# Patient Record
Sex: Male | Born: 1940 | Race: White | Hispanic: No | Marital: Married | Smoking: Never smoker
Health system: Southern US, Community
[De-identification: ages and names within clinical notes are randomized; demographics above are authoritative.]

## PROBLEM LIST (undated history)

## (undated) DIAGNOSIS — J45909 Unspecified asthma, uncomplicated: Secondary | ICD-10-CM

## (undated) DIAGNOSIS — C801 Malignant (primary) neoplasm, unspecified: Secondary | ICD-10-CM

## (undated) DIAGNOSIS — I10 Essential (primary) hypertension: Secondary | ICD-10-CM

## (undated) DIAGNOSIS — Z950 Presence of cardiac pacemaker: Secondary | ICD-10-CM

## (undated) HISTORY — PX: CARDIAC SURGERY: SHX584

---

## 2009-11-12 ENCOUNTER — Emergency Department: Payer: Self-pay | Admitting: Emergency Medicine

## 2010-04-16 ENCOUNTER — Emergency Department: Payer: Self-pay | Admitting: Emergency Medicine

## 2012-04-19 IMAGING — CT CT CHEST W/ CM
2 series · 15 of 31 positions shown, 19 images · IV contrast (APPLIED)
Comparison: None

REASON FOR EXAM: dyspnea, tachycardia, h/o lung cancer sp resection
COMMENTS:

PROCEDURE:     CT  - CT CHEST (FOR PE) W  - April 16, 2010  [DATE]
RESULT:     Indications: History of lung cancer, dyspnea, tachycardia
TECHNIQUE: A thin-section spiral CT from the lung apices to the upper
abdomen was acquired on a multi slice scanner following 100ml Usovue-CKX
intravenous contrast. These images were then transferred to the Siemens work
station and were subsequently reviewed utilizing 3-D reconstructions and MIP
images.

[Series 4: soft tissue · axial · 0.80mm/px · z∈[-435,-387]mm · 2 of 101 slices shown]
[im 8/101  mediastinal]
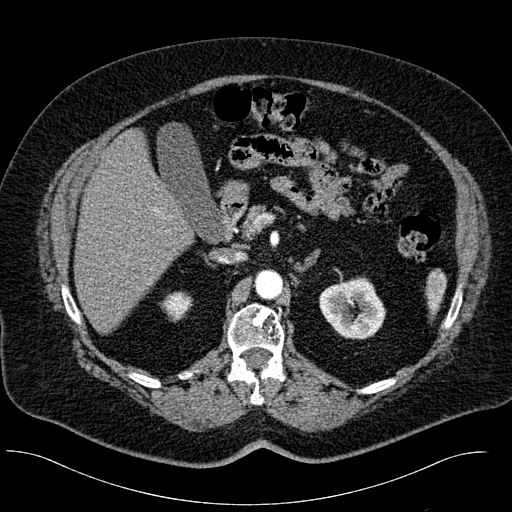
[im 24/101  mediastinal]
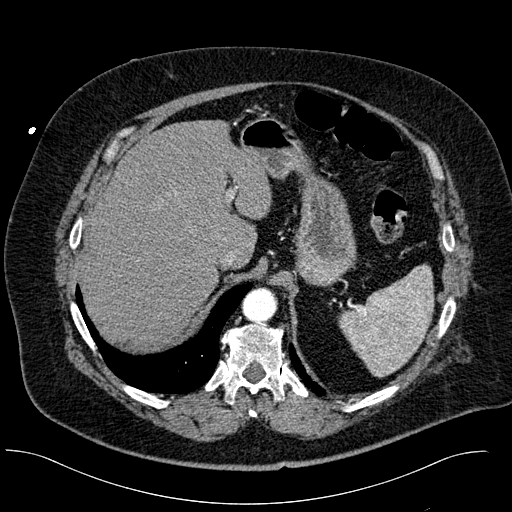

[Series 5: lung windows · axial · 0.80mm/px · z∈[-429,-180]mm · 13 of 99 slices shown, 17 images]
[im 8/99  mediastinal]
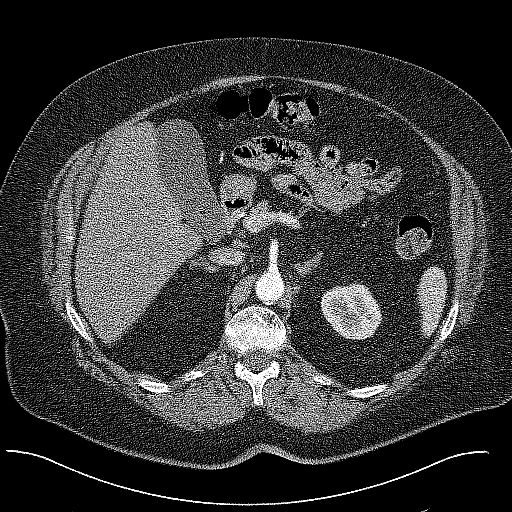
[im 8/99  lung]
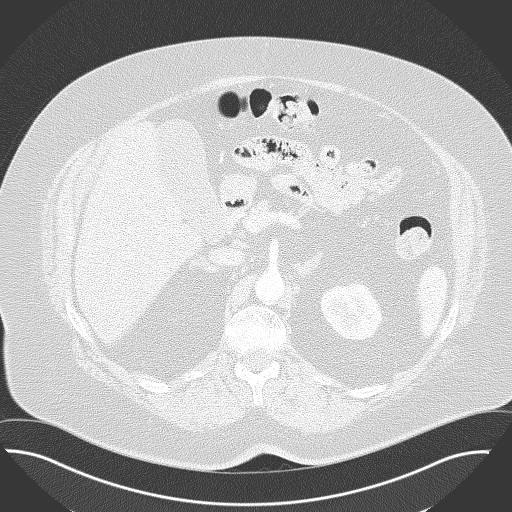
[im 16/99  lung]
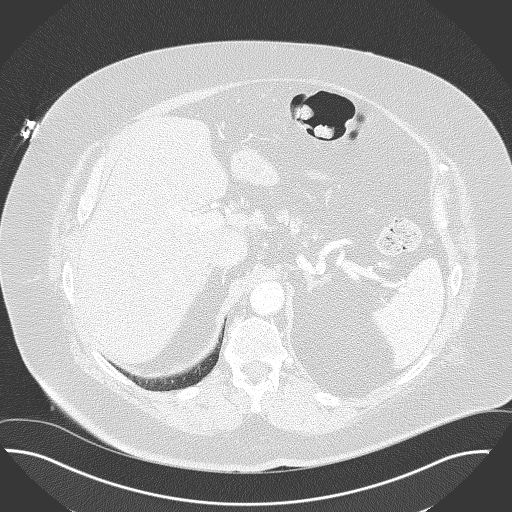
[im 23/99  lung]
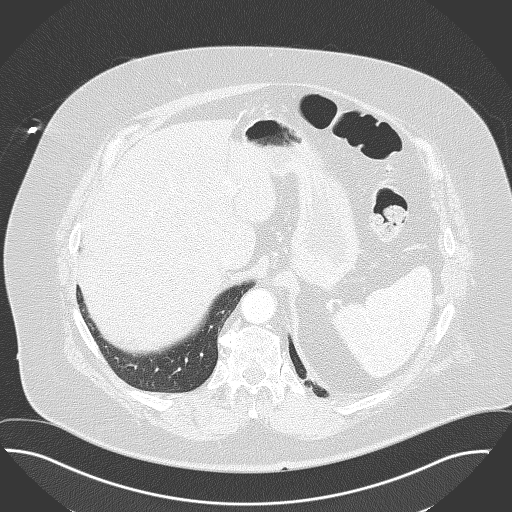
[im 31/99  lung]
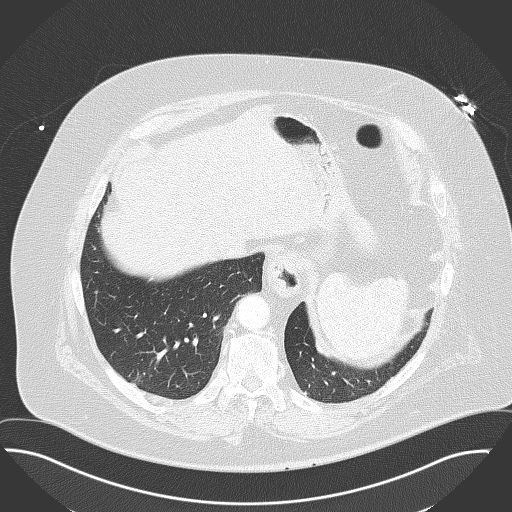
[im 38/99  mediastinal]
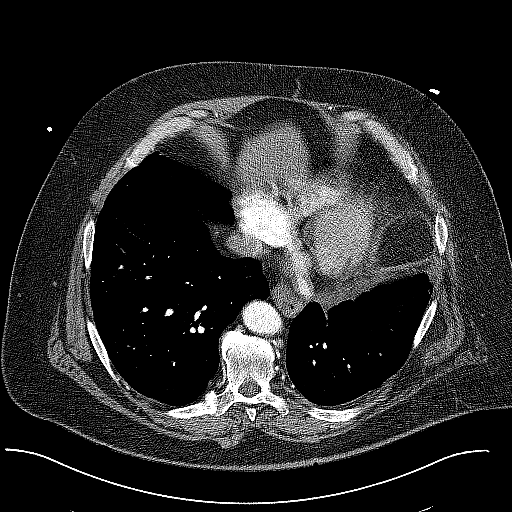
[im 38/99  lung]
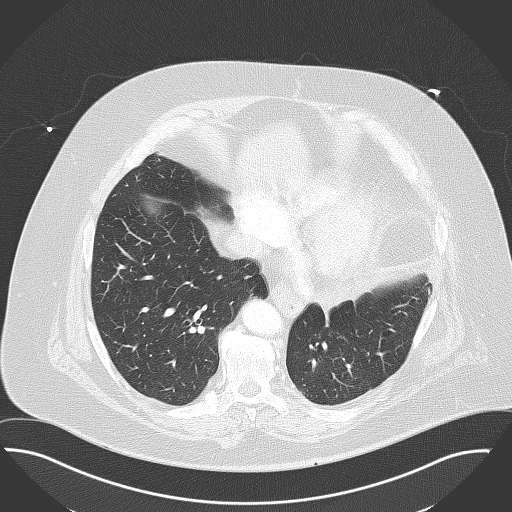
[im 46/99  lung]
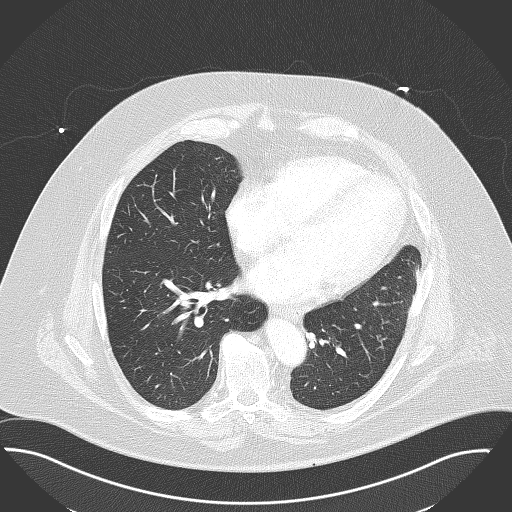
[im 50/99  lung]
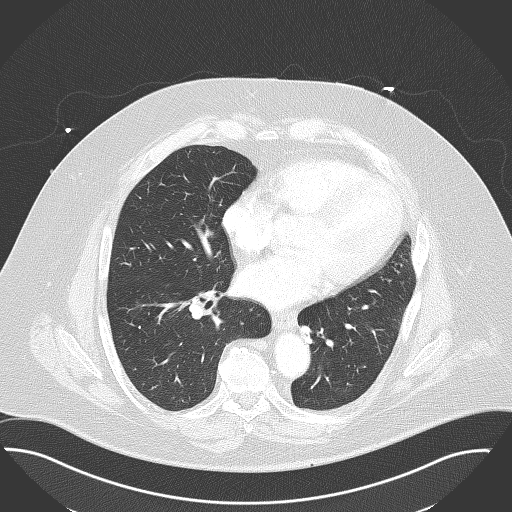
[im 53/99  lung]
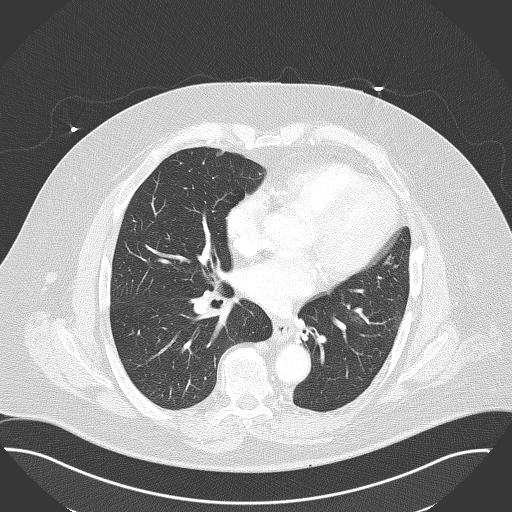
[im 61/99  mediastinal]
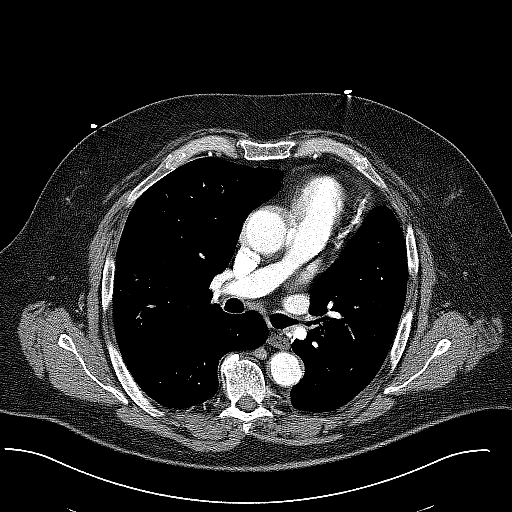
[im 61/99  lung]
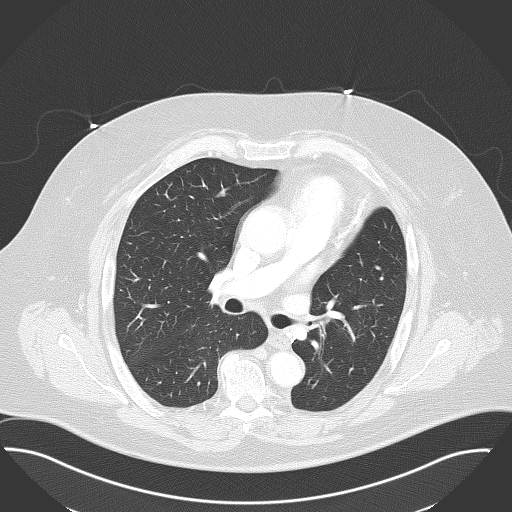
[im 68/99  lung]
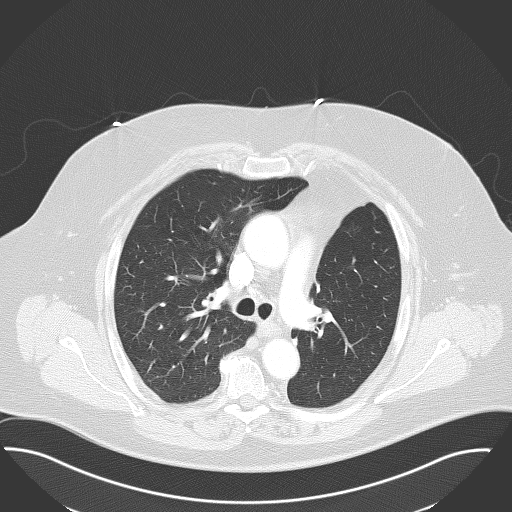
[im 76/99  lung]
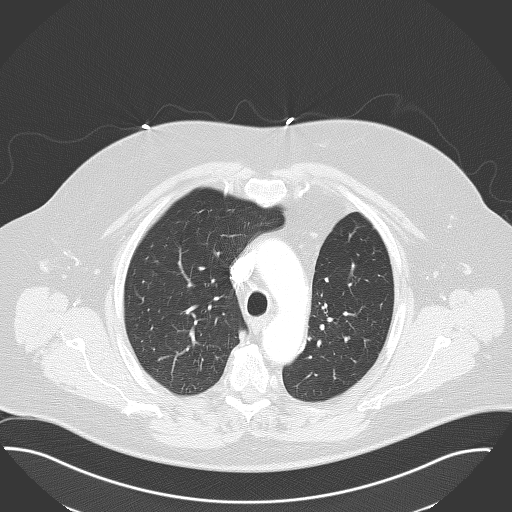
[im 83/99  lung]
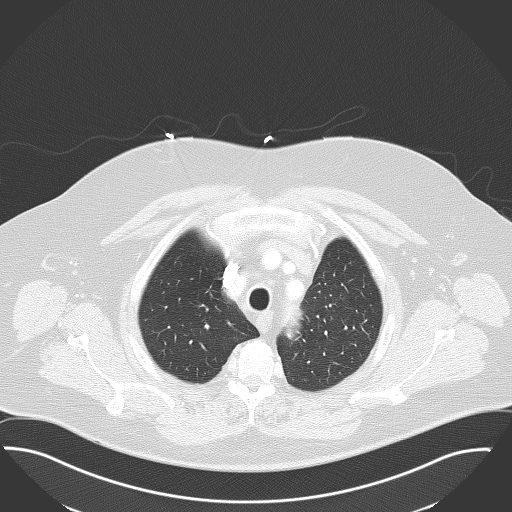
[im 91/99  mediastinal]
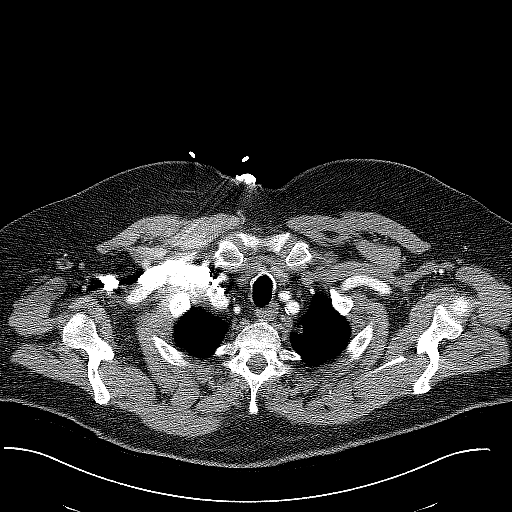
[im 91/99  lung]
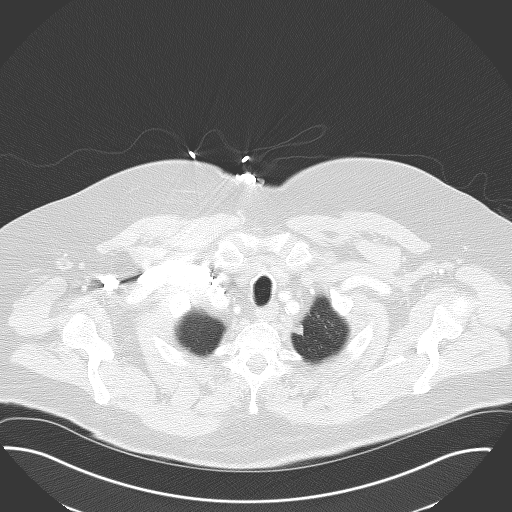

[15 of 31 positions shown; findings below may reference images not displayed]

FINDINGS: There is adequate opacification of the pulmonary arteries. There is no
pulmonary embolus. The main pulmonary artery, right main pulmonary artery,
and left main pulmonary arteries are normal in size. The heart size is
normal. There is no pericardial effusion. There is thoracic aortic
atherosclerosis. There is coronary artery atherosclerosis involving the LAD
and right coronary artery.

There is evidence of prior left partial pneumonectomy. The lungs are clear.
There is no focal consolidation, pleural effusion, or pneumothorax.

There is no axillary, hilar, or mediastinal adenopathy.

There is a small axial type hiatal hernia.

There is a small hiatal hernia.
IMPRESSION: 1. No CT evidence of pulmonary embolus.

2. Coronary artery disease.

## 2014-11-07 ENCOUNTER — Emergency Department: Payer: Self-pay | Admitting: Emergency Medicine

## 2014-11-07 LAB — COMPREHENSIVE METABOLIC PANEL
ALK PHOS: 67 U/L
ALT: 19 U/L
Albumin: 3.6 g/dL (ref 3.4–5.0)
Anion Gap: 11 (ref 7–16)
BUN: 20 mg/dL — ABNORMAL HIGH (ref 7–18)
Bilirubin,Total: 0.5 mg/dL (ref 0.2–1.0)
CHLORIDE: 102 mmol/L (ref 98–107)
CREATININE: 0.99 mg/dL (ref 0.60–1.30)
Calcium, Total: 8.9 mg/dL (ref 8.5–10.1)
Co2: 25 mmol/L (ref 21–32)
EGFR (Non-African Amer.): >60
Glucose: 107 mg/dL — ABNORMAL HIGH (ref 65–99)
OSMOLALITY: 279 (ref 275–301)
Potassium: 3.4 mmol/L — ABNORMAL LOW (ref 3.5–5.1)
SGOT(AST): 22 U/L (ref 15–37)
SODIUM: 138 mmol/L (ref 136–145)
Total Protein: 7 g/dL (ref 6.4–8.2)

## 2014-11-07 LAB — CBC
HCT: 38.8 % — AB (ref 40.0–52.0)
HGB: 12.9 g/dL — ABNORMAL LOW (ref 13.0–18.0)
MCH: 29.2 pg (ref 26.0–34.0)
MCHC: 33.2 g/dL (ref 32.0–36.0)
MCV: 88 fL (ref 80–100)
Platelet: 197 10*3/uL (ref 150–440)
RBC: 4.42 10*6/uL (ref 4.40–5.90)
RDW: 13.7 % (ref 11.5–14.5)
WBC: 12.4 10*3/uL — ABNORMAL HIGH (ref 3.8–10.6)

## 2014-11-07 LAB — TROPONIN I: TROPONIN-I: 0.02 ng/mL

## 2015-03-06 ENCOUNTER — Emergency Department: Admit: 2015-03-06 | Disposition: A | Discharge status: Home / Self Care | Payer: Self-pay | Admitting: Family Medicine

## 2015-04-17 ENCOUNTER — Emergency Department
Admission: EM | Admit: 2015-04-17 | Discharge: 2015-04-17 | Disposition: A | Discharge status: Home / Self Care | Payer: Medicare Other | Attending: Emergency Medicine | Admitting: Emergency Medicine

## 2015-04-17 ENCOUNTER — Encounter: Payer: Self-pay | Admitting: Emergency Medicine

## 2015-04-17 ENCOUNTER — Emergency Department: Payer: Medicare Other

## 2015-04-17 DIAGNOSIS — C3490 Malignant neoplasm of unspecified part of unspecified bronchus or lung: Secondary | ICD-10-CM | POA: Diagnosis not present

## 2015-04-17 DIAGNOSIS — C61 Malignant neoplasm of prostate: Secondary | ICD-10-CM | POA: Diagnosis not present

## 2015-04-17 DIAGNOSIS — M898X9 Other specified disorders of bone, unspecified site: Secondary | ICD-10-CM

## 2015-04-17 DIAGNOSIS — C799 Secondary malignant neoplasm of unspecified site: Secondary | ICD-10-CM | POA: Insufficient documentation

## 2015-04-17 DIAGNOSIS — I10 Essential (primary) hypertension: Secondary | ICD-10-CM | POA: Insufficient documentation

## 2015-04-17 DIAGNOSIS — C419 Malignant neoplasm of bone and articular cartilage, unspecified: Secondary | ICD-10-CM | POA: Diagnosis not present

## 2015-04-17 DIAGNOSIS — R079 Chest pain, unspecified: Secondary | ICD-10-CM | POA: Diagnosis present

## 2015-04-17 DIAGNOSIS — C7951 Secondary malignant neoplasm of bone: Secondary | ICD-10-CM

## 2015-04-17 HISTORY — DX: Presence of cardiac pacemaker: Z95.0

## 2015-04-17 HISTORY — DX: Malignant (primary) neoplasm, unspecified: C80.1

## 2015-04-17 HISTORY — DX: Essential (primary) hypertension: I10

## 2015-04-17 HISTORY — DX: Unspecified asthma, uncomplicated: J45.909

## 2015-04-17 LAB — CBC
HCT: 40.1 % (ref 40.0–52.0)
Hemoglobin: 13.5 g/dL (ref 13.0–18.0)
MCH: 28.3 pg (ref 26.0–34.0)
MCHC: 33.7 g/dL (ref 32.0–36.0)
MCV: 83.9 fL (ref 80.0–100.0)
Platelets: 201 10*3/uL (ref 150–440)
RBC: 4.78 MIL/uL (ref 4.40–5.90)
RDW: 14.6 % — ABNORMAL HIGH (ref 11.5–14.5)
WBC: 12.3 10*3/uL — ABNORMAL HIGH (ref 3.8–10.6)

## 2015-04-17 LAB — BASIC METABOLIC PANEL
Anion gap: 11 (ref 5–15)
BUN: 27 mg/dL — ABNORMAL HIGH (ref 6–20)
CHLORIDE: 104 mmol/L (ref 101–111)
CO2: 25 mmol/L (ref 22–32)
Calcium: 9.2 mg/dL (ref 8.9–10.3)
Creatinine, Ser: 1.12 mg/dL (ref 0.61–1.24)
GFR calc Af Amer: >60 mL/min (ref >60)
Glucose, Bld: 119 mg/dL — ABNORMAL HIGH (ref 65–99)
Potassium: 3.7 mmol/L (ref 3.5–5.1)
Sodium: 140 mmol/L (ref 135–145)

## 2015-04-17 LAB — TROPONIN I: Troponin I: 0.03 ng/mL (ref <0.031)

## 2015-04-17 MED ORDER — HYDROMORPHONE HCL 1 MG/ML IJ SOLN
INTRAMUSCULAR | Status: AC
  Filled 2015-04-17: qty 1

## 2015-04-17 MED ORDER — OXYCODONE-ACETAMINOPHEN 10-325 MG PO TABS: 1.0000 | ORAL_TABLET | Freq: Four times a day (QID) | ORAL | Status: AC | PRN

## 2015-04-17 MED ORDER — HYDROMORPHONE HCL 1 MG/ML IJ SOLN
1.0000 mg | Freq: Once | INTRAMUSCULAR | Status: AC
  Administered 2015-04-17: 1 mg via INTRAMUSCULAR

## 2015-04-17 NOTE — ED Notes (Signed)
Patient reports chest pain that began at approx 0300. States he went to relax on couch at midnight. Noticed chest pain, took pain medicine and Aleve with no relief.

## 2015-04-17 NOTE — Discharge Instructions (Signed)
Bone Metastases °Cancerous growths can begin in any part of the body. The original site of cancer is called the primary tumor or primary cancer (for example, breast cancer). After cancer has developed in one area of the body, cancerous cells from that area can break away and travel through the body's bloodstream. If these cancerous cells begin growing in another place in the body, they are called metastases. Bone metastases are cancer cells that have spread to the bone (which is different from a cancer that starts in the bone). °These secondary growths are like the original tumor. For example, if a prostate cancer spreads to bone it is called metastatic prostate cancer, or prostate cancer metastatic to bone, but not bone cancer. Cancers can spread to almost any bone; the spine and pelvis are often involved.  °Any type of cancer can spread to the bone, but the most common are breast, lung, kidney, thyroid and prostate cancers. Sometimes the primary tumor is not discovered until there are bone problems. If the primary cancer location cannot be discovered, the cancer is called cancer of unknown primary location. °SYMPTOMS  °Pain in the bones is the main symptom of bone metastases. Some other problems may occur first including: °· Decreased appetite. °· Nausea. °· Muscle weakness. °· Confusion. °· Unusual sleep patterns due to discomfort. °· Overly tired (fatigue). °· Restlessness. °Frail or brittle bones may lead to broken bones (fractures) that lead to learning what is wrong (diagnosis). A tumor often weakens the bones.  °DIAGNOSIS  °Metastatic cancers may be found months or years after or at the same time as the primary tumor. When a second tumor is found in a patient who has been treated for cancer, it is more often a metastasis than another primary tumor.  °The patient's symptoms, physical examination, X-rays and blood tests may suggest a bone metastases. In addition, an examination of tissue or a cell sample  (biopsy) is usually done to find the cancer. This sample is removed with a needle. This tissue sample must be looked at under a microscope to confirm a diagnosis. °TREATMENT  °Options generally include treatments that give relief from symptoms (palliative) or curative. Those with advanced, metastasized cancer may receive treatment focused on pain relief and prolonging life. These treatments depend on the type of cancer and its location.  °Treatment for cancer depends on its type and location. Some of these treatments are: °· Surgery to remove the original tumor and/or to remove parts of the body that produce hormones and other chemicals that make cancer worse. °· Treatment with drugs (chemotherapy). °· Bone marrow transplantations on rare occasions. °· Radiation therapy (radiotherapy). °· Hormonal therapy. °· Pain relieving medications. °Your caregiver will help you understand the likelihood that any particular treatment will be helpful for you. While some treatments aim to cure or control the cancer, others give relief from symptoms only. If you have bone metastases, radiation therapy may be recommended to treat pain (if it is in one main location). Pain medications are available. These include strong medicines like morphine. You may be instructed to take a long-acting pain medication (to control most of your pain) and a short-acting medication to control occasional flares of pain. Pain medication is sometimes also given continuously through a pump. °HOME CARE INSTRUCTIONS  °· Take medications exactly as prescribed. °· Keep any follow-up appointments. °· Pain medications can make you sleepy or confused. Do not drive, climb ladders, or do other dangerous activities while on pain medication. °· Pain medications often   cause constipation. Ask your caregiver for information on stool softeners.  Do not share your pain medication with others. SEEK MEDICAL CARE IF:   Your bone pain is not controlled.  You are having  problems or side effects from your medication.  You have excessive sleepiness or confusion. SEEK IMMEDIATE MEDICAL CARE IF:   You fall and have any injury or pain from the fall.  You have trouble walking.  You have numbness or tingling in your legs.  You develop a sudden significant worsening of your pain. Document Released: 11/10/2002 Document Revised: 02/12/2012 Document Reviewed: 07/03/2008 [facility] Patient Information 2015 [place], [place]. This information is not intended to replace advice given to you by your health care provider. Make sure you discuss any questions you have with your health care provider.    As we discussed please follow-up with your oncologist on Monday for further evaluation. Return to the emergency department for any personally concerning symptoms or worsening pain.

## 2015-04-17 NOTE — ED Notes (Signed)
NAD noted at time of D/C. Pt denies questions or concerns. Pt taken to the lobby via wheelchair at this time.  

## 2015-04-17 NOTE — ED Provider Notes (Addendum)
[facility] Emergency Department Provider Note  Time seen: 11:40 AM  I have reviewed the triage vital signs and the nursing notes.   HISTORY  Chief Complaint Chest Pain    HPI Adam Johns is a 74 y.o. male with a past medical history of cancer with multiple metastatic lesions who presents the emergency department with back pain, chest pain, bilateral rib pain, bilateral hip pain. Patient takes pain medication at home which he has taken yesterday and today without relief. Patient is seen at [facility] oncology. The patient had a PET scan done 2 days ago at [facility] but has not had the results yet.  Patient denies any shortness of breath, fever, abdominal pain, vomiting or diarrhea. Patient does state nausea. Describes the pain as dull, 10/10, diffusely but worse in his back ribs and hips. Worse with movement.   Past Medical History  Diagnosis Date  . Pacemaker   . Cancer     Lung, Prostate, Right femur currently  . Asthma   . Hypertension     There are no active problems to display for this patient.   Past Surgical History  Procedure Laterality Date  . Cardiac surgery      No current outpatient prescriptions on file.  Allergies Sulfa antibiotics  History reviewed. No pertinent family history.  Social History History  Substance Use Topics  . Smoking status: Never Smoker   . Smokeless tobacco: Never Used  . Alcohol Use: No    Review of Systems Constitutional: Negative for fever. Cardiovascular: Positive for chest wall/rib pains bilaterally Respiratory: Negative for shortness of breath. Gastrointestinal: Negative for abdominal pain, vomiting and diarrhea. Positive for nausea. Genitourinary: Negative for dysuria. Musculoskeletal: Positive for back and pelvis/hip pains Skin: Negative for rash.  10-point ROS otherwise negative.  ____________________________________________   PHYSICAL EXAM:  VITAL SIGNS: ED Triage Vitals  Enc Vitals  Group     BP 04/17/15 0922 179/86 mmHg     Pulse Rate 04/17/15 0922 75     Resp 04/17/15 0922 20     Temp 04/17/15 0922 98 F (36.7 C)     Temp Source 04/17/15 0922 Oral     SpO2 04/17/15 0922 98 %     Weight 04/17/15 0922 185 lb (83.915 kg)     Height 04/17/15 0922 '5\' 7"'$  (1.702 m)     Head Cir --      Peak Flow --      Pain Score 04/17/15 0918 10     Pain Loc --      Pain Edu? --      Excl. in [place]? --     Constitutional: Alert and oriented. No acute distress but states 10/10 pain. Eyes: Normal exam Cardiovascular: Normal rate, regular rhythm. No murmurs, rubs, or gallops. Respiratory: Normal respiratory effort without tachypnea nor retractions. Breath sounds are clear and equal bilaterally. No wheezes/rales/rhonchi. Chest tenderness bilaterally right greater than left. Gastrointestinal: Soft and nontender. No distention.  Musculoskeletal: Tenderness to hip palpation but normal range of motion. Tenderness to spinal palpation diffusely. Does not appear to have any focal tenderness worse at any other point. No neck tenderness. Neurologic:  Normal speech and language. No gross focal neurologic deficits are appreciated. Speech is normal. Normal sensation in all extremities. Skin:  Skin is warm, dry and intact.  Psychiatric: Mood and affect are normal. Speech and behavior are normal. Patient exhibits appropriate insight and judgment.  ____________________________________________    EKG  EKG shows an atrial sensed ventricularly  paced rhythm at 72 bpm.  ____________________________________________    RADIOLOGY  In review of patient's recent PET scan at [facility], results show multiple metastatic lesions including T-spine and L-spine, pelvis and multiple ribs as well as right femur, also possible metastatic lesions to liver. Chest x-ray shows no acute findings.   INITIAL IMPRESSION / ASSESSMENT AND PLAN / ED COURSE  Pertinent labs & imaging results that were available during my care  of the patient were reviewed by me and considered in my medical decision making (see chart for details).  Patient presents with multiple pain complaints mostly of the back ribs and pelvis. Labs are largely within normal limits with a negative troponin. EKG is a paced EKG. Recent PET scan shows multiple metastatic lesions which are unfortunately likely the cause of the patient's increased pain. Patient denies any recent falls or trauma. Does not appear to have any acute focal pain is more of a diffuse pain, I do not suspect any acute pathologic fractures based on my exam.  Lab work within normal limits. Patient's pain much improved. I discussed with patient need to follow-up with his oncologist on Monday. Patient agreeable to plan. We'll discharge home.  ----------------------------------------- 2:40 PM on 04/17/2015 -----------------------------------------  Patient states he is out of his home Percocet medication, he is 16 doses (2 days) short and will not see his oncologist until Monday. We'll prescribe his Percocet for him and he will follow-up with his primary care on Monday. ____________________________________________   FINAL CLINICAL IMPRESSION(S) / ED DIAGNOSES  Musculoskeletal pain Metastatic spread of cancer Osseous spread of cancer   Harvest Dark, MD 04/17/15 [place], MD 04/17/15 1440

## 2016-10-04 DEATH — deceased

## 2017-03-09 IMAGING — CR LEFT RIBS AND CHEST - 3+ VIEW
1 series · 5 of 5 positions shown · non-contrast
Comparison: 11/07/2014, CT 04/16/2010

CLINICAL DATA: Fell today at home. Pain in the left mid lateral
ribs without specific point tenderness.

EXAM:
LEFT RIBS AND CHEST - 3+ VIEW

[Series 1: dxr ribs left unilateral · 0.14mm/px · 5 of 5 slices shown]
[im 1/5]
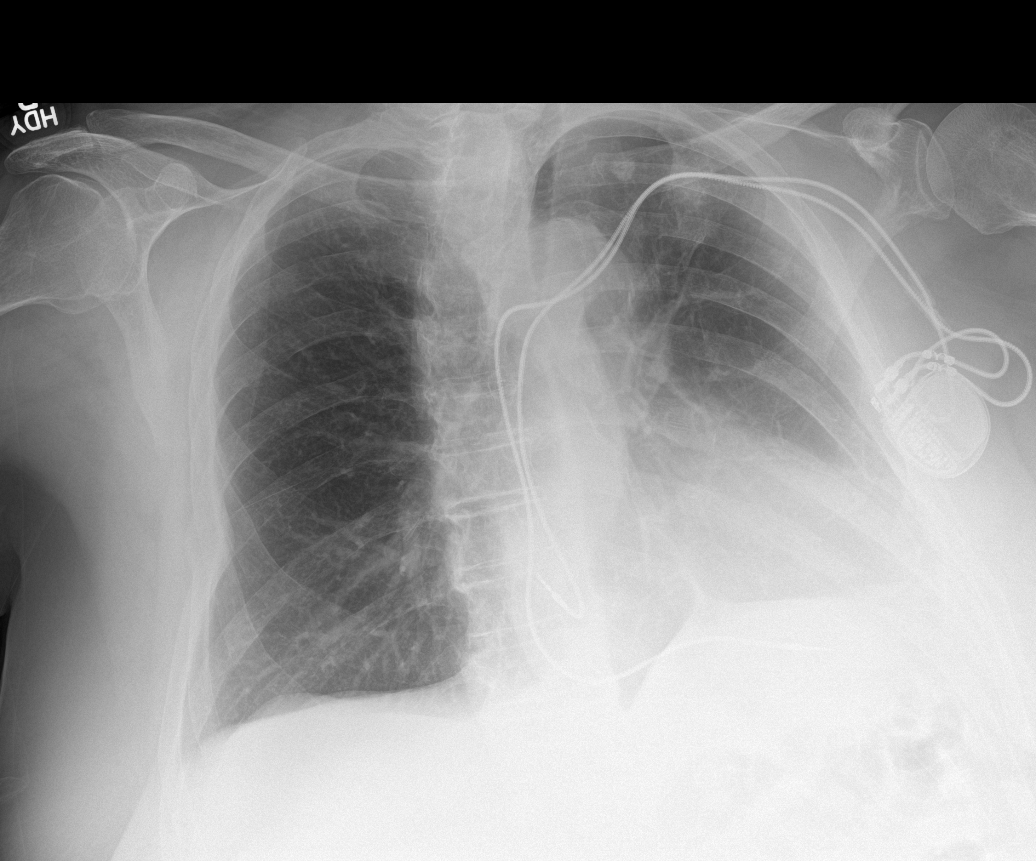
[im 2/5]
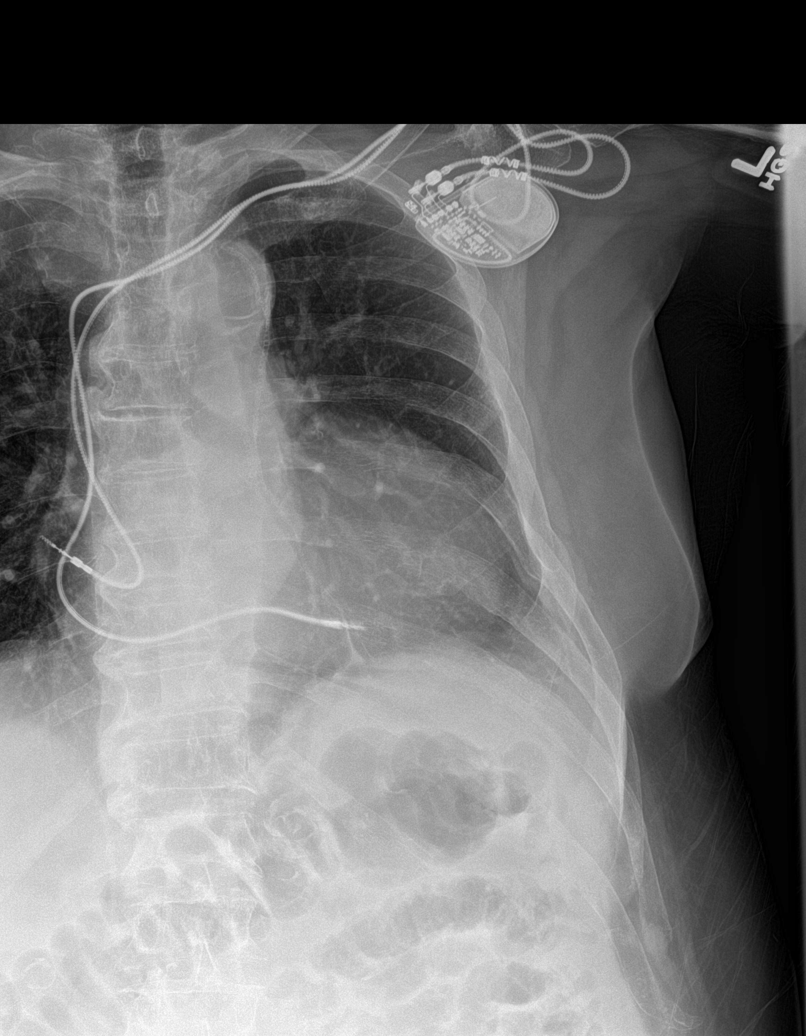
[im 3/5]
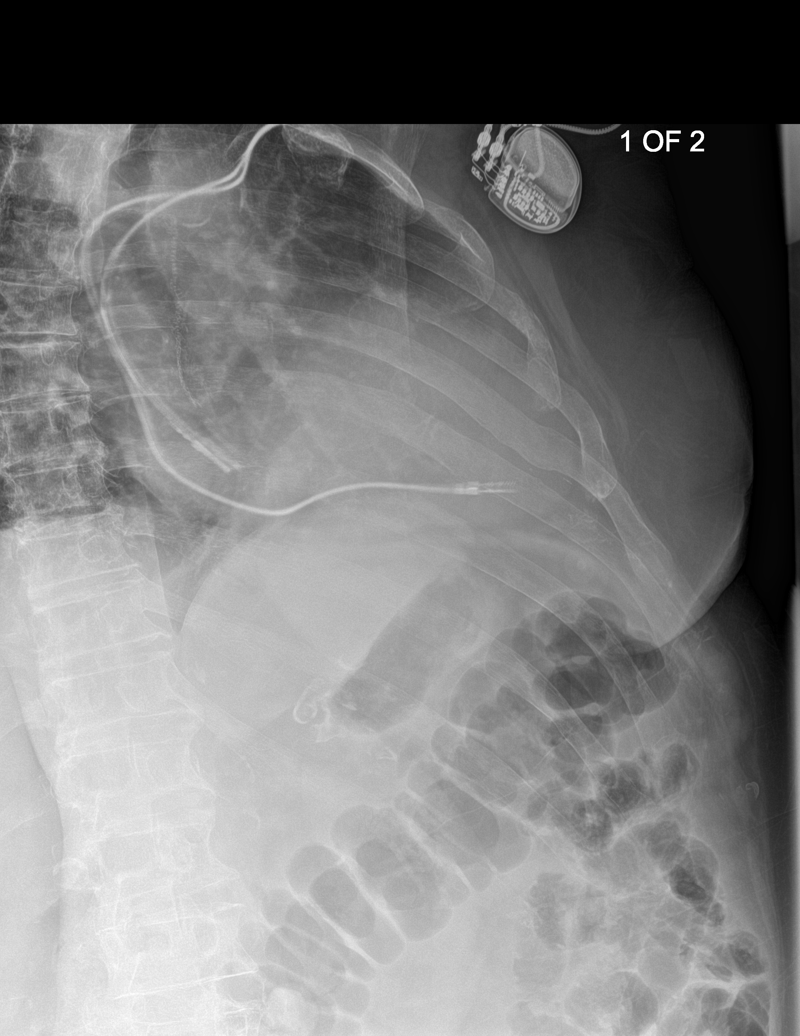
[im 4/5]
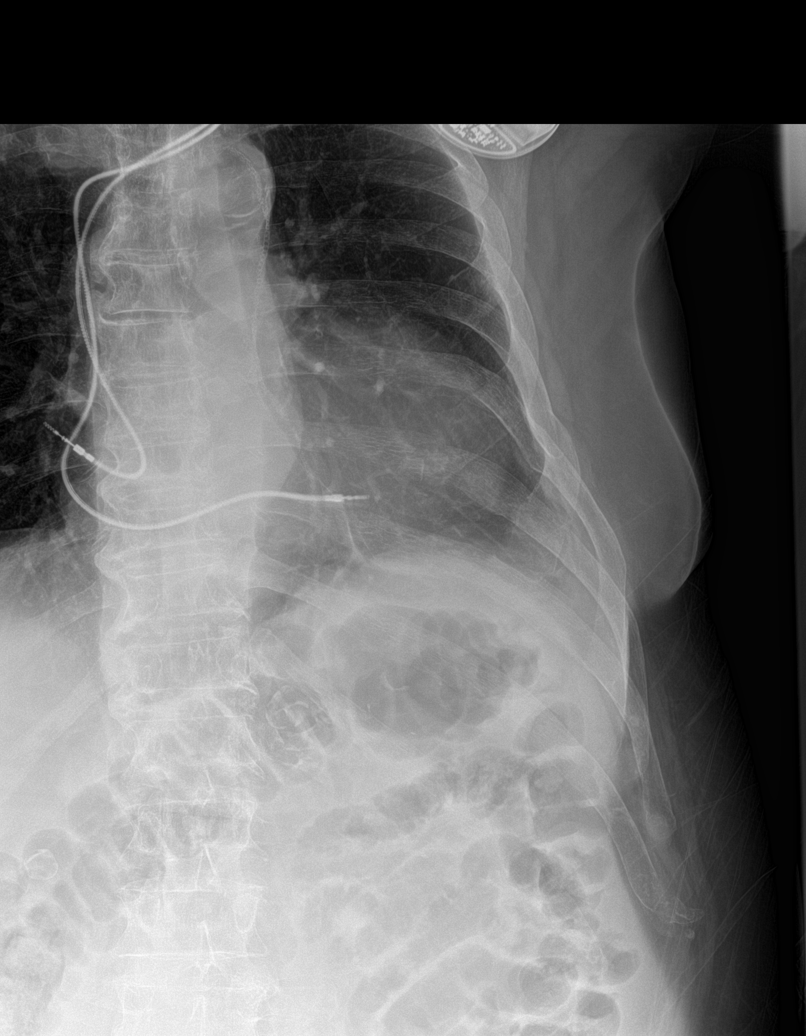
[im 5/5]
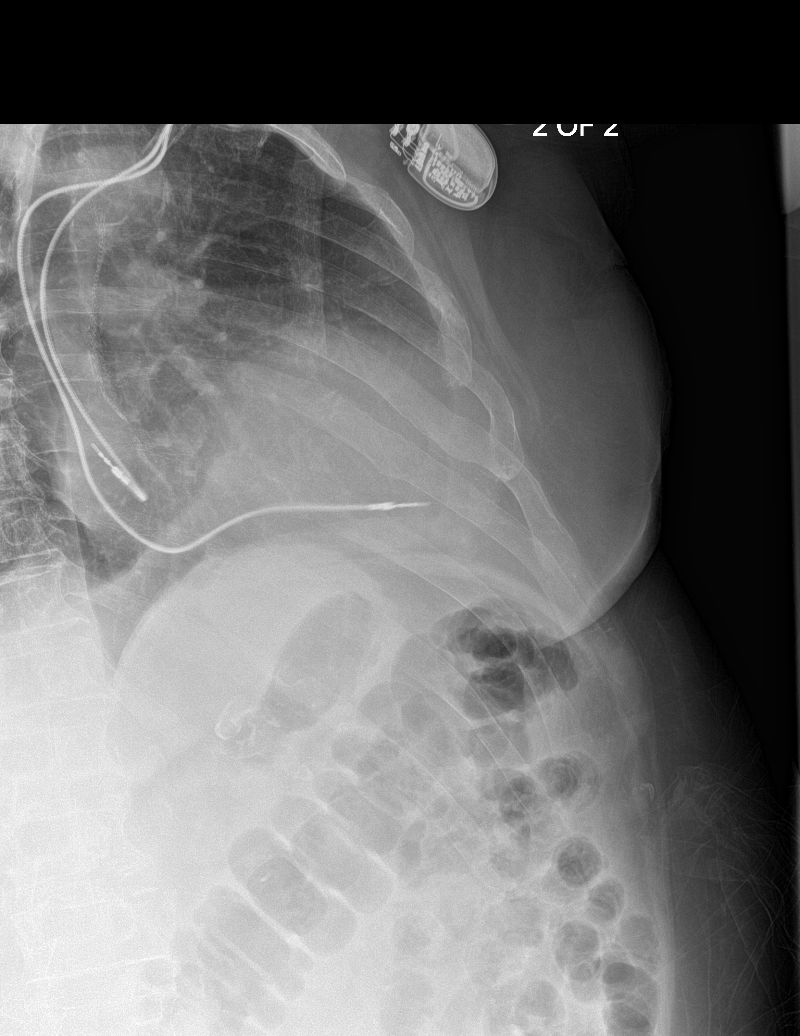

[5 of 5 positions shown; findings below may reference images not displayed]

FINDINGS: There are chronic healed fracture deformities of the left sixth
seventh and eighth ribs. No superimposed acute displaced rib
fracture is evident. There is no pneumothorax or effusion.
Mediastinal contours are normal and unchanged. There is mild
cardiomegaly. There are grossly intact appearances of the
transvenous leads. Unchanged linear scarring is present in the left
base. The right lung is clear.
IMPRESSION: Negative for acute traumatic injury.  Unchanged cardiomegaly.

## 2017-03-09 IMAGING — CR RIGHT ANKLE - COMPLETE 3+ VIEW
1 series · 3 of 3 positions shown · non-contrast
Comparison: None.

CLINICAL DATA: Fall, right anterior ankle pain.

EXAM:
RIGHT ANKLE - COMPLETE 3+ VIEW

[Series 1: dxr ankle right complete · 0.14mm/px · 3 of 3 slices shown]
[im 1/3]
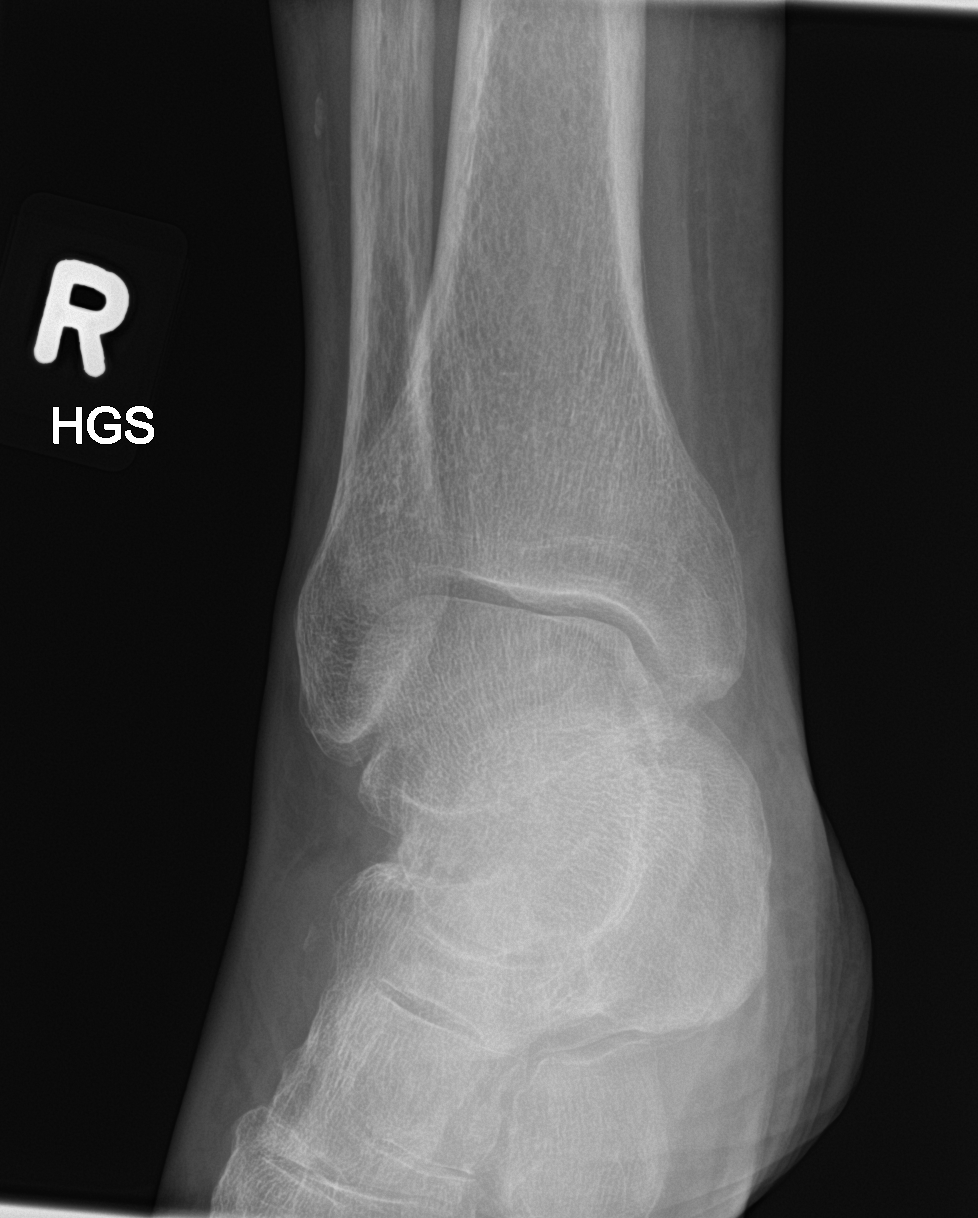
[im 2/3]
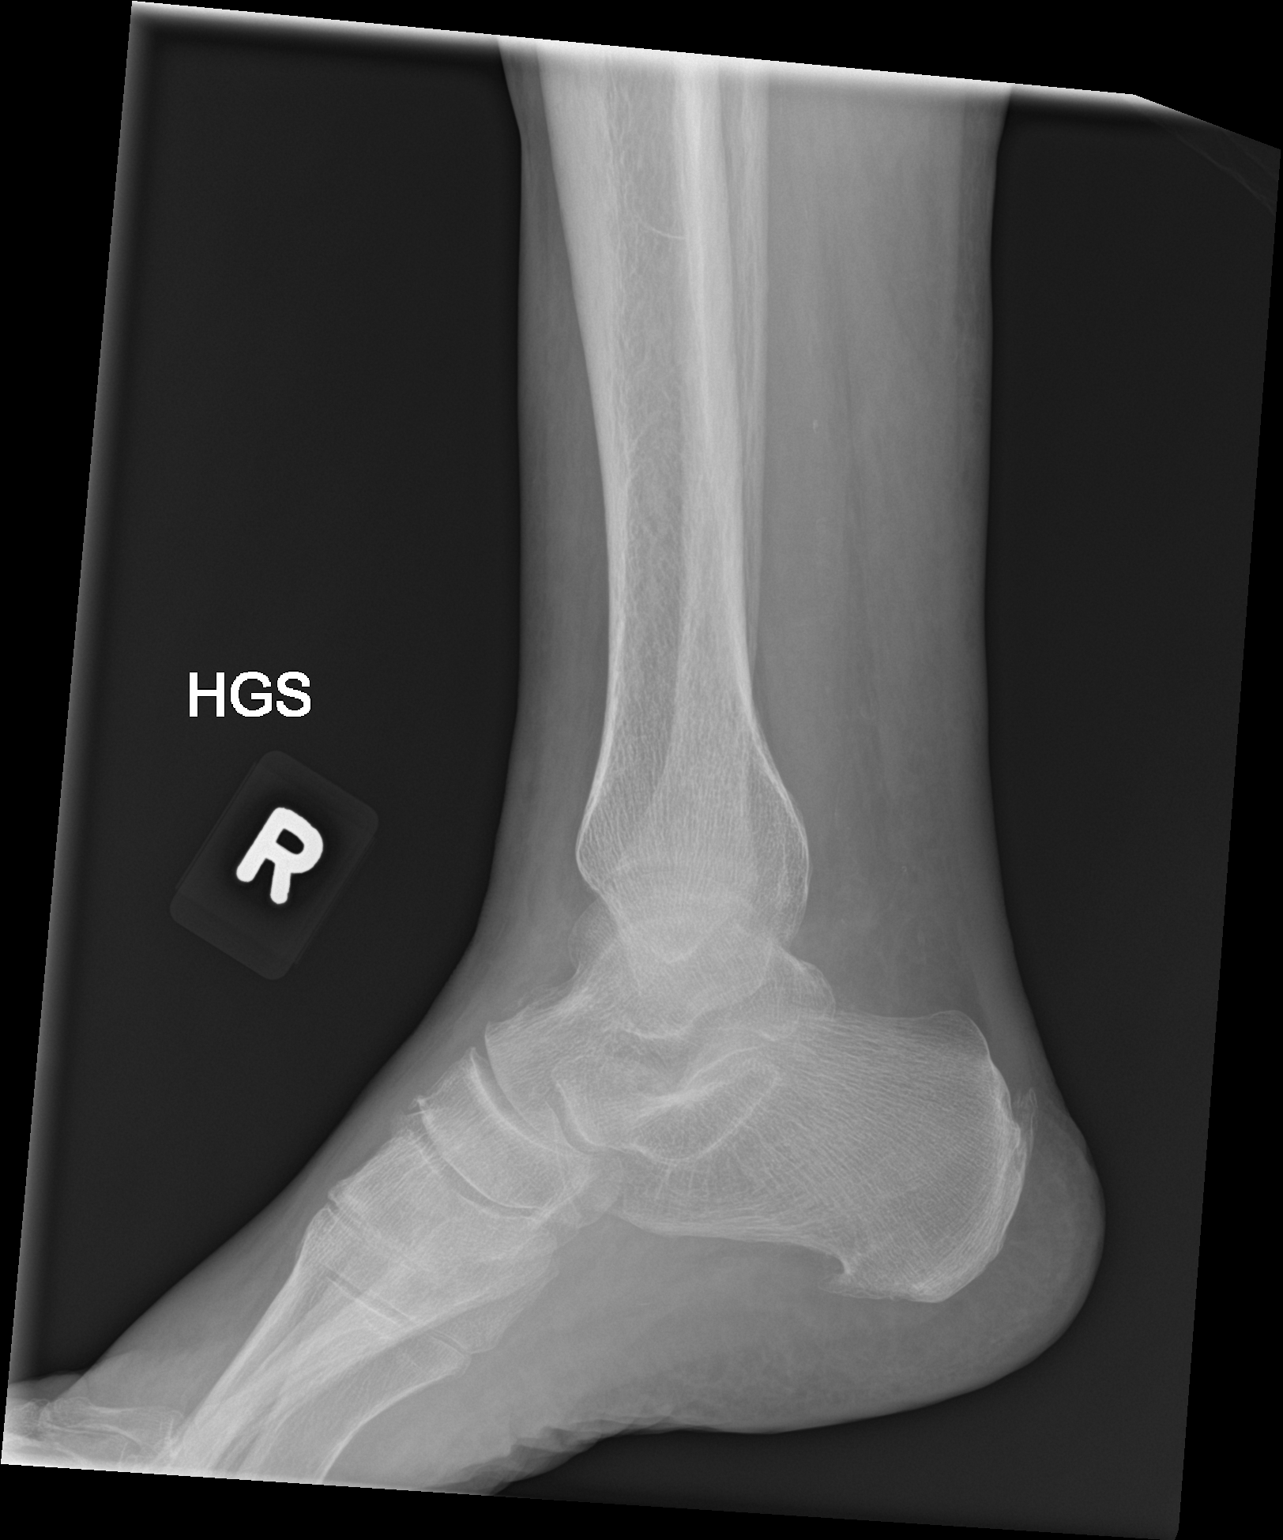
[im 3/3]
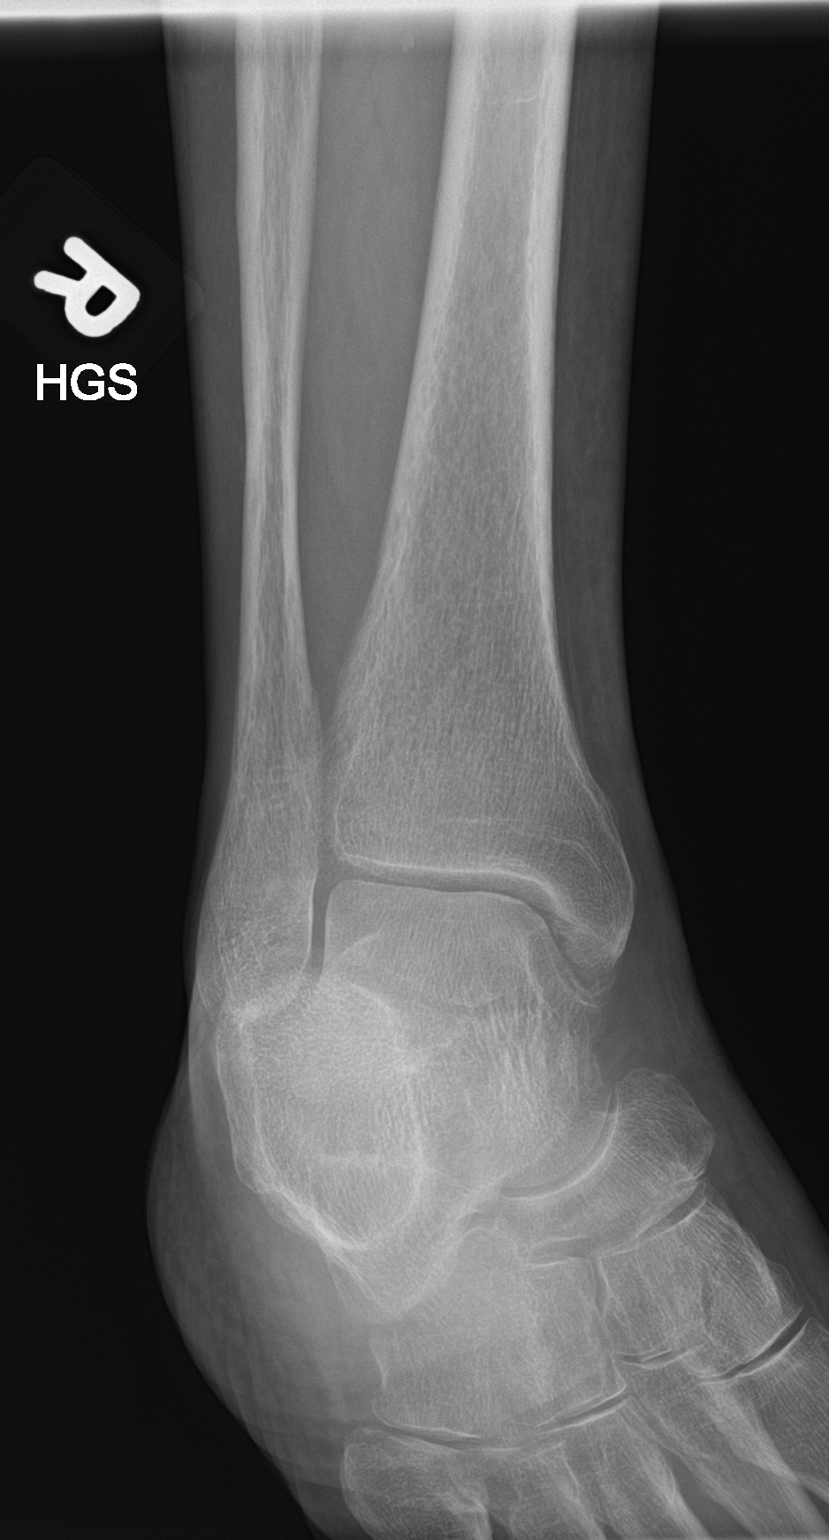

[3 of 3 positions shown; findings below may reference images not displayed]

FINDINGS: There is no evidence of fracture, dislocation, or joint effusion.
There is no evidence of arthropathy or other focal bone abnormality.
Soft tissues are unremarkable. Plantar calcaneal spurring is noted.
On the mortise view, there is possible cortical discontinuity of the
distal fibula but this is not reproduced on the other views. No
overlying soft tissue abnormality is identified. The mortise is
symmetric.
IMPRESSION: On one view only, there is possible cortical discontinuity at the
distal right fibula but this is not confirmed on other views.
Correlation for point tenderness to this area is recommended.
Followup imaging could be performed in 7-10 days for detection of
possibly radiographically occult fracture if clinically needed.
